# Patient Record
Sex: Female | Born: 1935 | Race: Black or African American | Hispanic: No | Smoking: Never smoker
Health system: Southern US, Community
[De-identification: ages and names within clinical notes are randomized; demographics above are authoritative.]

## PROBLEM LIST (undated history)

## (undated) DIAGNOSIS — I82409 Acute embolism and thrombosis of unspecified deep veins of unspecified lower extremity: Secondary | ICD-10-CM

## (undated) DIAGNOSIS — I10 Essential (primary) hypertension: Secondary | ICD-10-CM

## (undated) DIAGNOSIS — N289 Disorder of kidney and ureter, unspecified: Secondary | ICD-10-CM

## (undated) DIAGNOSIS — C169 Malignant neoplasm of stomach, unspecified: Secondary | ICD-10-CM

## (undated) HISTORY — PX: STOMACH SURGERY: SHX791

## (undated) HISTORY — PX: ANEURYSM COILING: SHX5349

---

## 1978-05-12 DIAGNOSIS — C169 Malignant neoplasm of stomach, unspecified: Secondary | ICD-10-CM

## 1978-05-12 HISTORY — DX: Malignant neoplasm of stomach, unspecified: C16.9

## 2006-05-12 DIAGNOSIS — I82409 Acute embolism and thrombosis of unspecified deep veins of unspecified lower extremity: Secondary | ICD-10-CM

## 2006-05-12 HISTORY — DX: Acute embolism and thrombosis of unspecified deep veins of unspecified lower extremity: I82.409

## 2016-10-02 ENCOUNTER — Emergency Department (HOSPITAL_COMMUNITY): Payer: Medicare Other

## 2016-10-02 ENCOUNTER — Inpatient Hospital Stay (HOSPITAL_COMMUNITY)
Admission: EM | Admit: 2016-10-02 | Discharge: 2016-10-04 | DRG: 683 | Disposition: A | Discharge status: Home / Self Care | Payer: Medicare Other | Attending: Internal Medicine | Admitting: Internal Medicine

## 2016-10-02 ENCOUNTER — Encounter (HOSPITAL_COMMUNITY): Payer: Self-pay

## 2016-10-02 DIAGNOSIS — N183 Chronic kidney disease, stage 3 unspecified: Secondary | ICD-10-CM

## 2016-10-02 DIAGNOSIS — Z86718 Personal history of other venous thrombosis and embolism: Secondary | ICD-10-CM

## 2016-10-02 DIAGNOSIS — E872 Acidosis, unspecified: Secondary | ICD-10-CM

## 2016-10-02 DIAGNOSIS — Z903 Acquired absence of stomach [part of]: Secondary | ICD-10-CM

## 2016-10-02 DIAGNOSIS — D649 Anemia, unspecified: Secondary | ICD-10-CM

## 2016-10-02 DIAGNOSIS — F0390 Unspecified dementia without behavioral disturbance: Secondary | ICD-10-CM | POA: Diagnosis present

## 2016-10-02 DIAGNOSIS — D631 Anemia in chronic kidney disease: Secondary | ICD-10-CM | POA: Diagnosis present

## 2016-10-02 DIAGNOSIS — E441 Mild protein-calorie malnutrition: Secondary | ICD-10-CM | POA: Diagnosis present

## 2016-10-02 DIAGNOSIS — Z85028 Personal history of other malignant neoplasm of stomach: Secondary | ICD-10-CM

## 2016-10-02 DIAGNOSIS — N179 Acute kidney failure, unspecified: Principal | ICD-10-CM | POA: Diagnosis present

## 2016-10-02 DIAGNOSIS — R627 Adult failure to thrive: Secondary | ICD-10-CM | POA: Diagnosis present

## 2016-10-02 DIAGNOSIS — K5900 Constipation, unspecified: Secondary | ICD-10-CM | POA: Diagnosis present

## 2016-10-02 DIAGNOSIS — Z9049 Acquired absence of other specified parts of digestive tract: Secondary | ICD-10-CM

## 2016-10-02 DIAGNOSIS — R531 Weakness: Secondary | ICD-10-CM

## 2016-10-02 DIAGNOSIS — Z681 Body mass index (BMI) 19 or less, adult: Secondary | ICD-10-CM

## 2016-10-02 DIAGNOSIS — M7989 Other specified soft tissue disorders: Secondary | ICD-10-CM | POA: Diagnosis not present

## 2016-10-02 DIAGNOSIS — N184 Chronic kidney disease, stage 4 (severe): Secondary | ICD-10-CM | POA: Diagnosis present

## 2016-10-02 DIAGNOSIS — E869 Volume depletion, unspecified: Secondary | ICD-10-CM | POA: Diagnosis present

## 2016-10-02 DIAGNOSIS — I129 Hypertensive chronic kidney disease with stage 1 through stage 4 chronic kidney disease, or unspecified chronic kidney disease: Secondary | ICD-10-CM | POA: Diagnosis present

## 2016-10-02 HISTORY — DX: Malignant neoplasm of stomach, unspecified: C16.9

## 2016-10-02 HISTORY — DX: Essential (primary) hypertension: I10

## 2016-10-02 HISTORY — DX: Disorder of kidney and ureter, unspecified: N28.9

## 2016-10-02 HISTORY — DX: Acute embolism and thrombosis of unspecified deep veins of unspecified lower extremity: I82.409

## 2016-10-02 LAB — URINALYSIS, ROUTINE W REFLEX MICROSCOPIC
Bilirubin Urine: NEGATIVE
Glucose, UA: NEGATIVE mg/dL
HGB URINE DIPSTICK: NEGATIVE
Ketones, ur: NEGATIVE mg/dL
Nitrite: NEGATIVE
Protein, ur: NEGATIVE mg/dL
SPECIFIC GRAVITY, URINE: 1.01 (ref 1.005–1.030)
Squamous Epithelial / LPF: NONE SEEN
pH: 5 (ref 5.0–8.0)

## 2016-10-02 LAB — COMPREHENSIVE METABOLIC PANEL
ALK PHOS: 79 U/L (ref 38–126)
ALT: 31 U/L (ref 14–54)
AST: 24 U/L (ref 15–41)
Albumin: 3.3 g/dL — ABNORMAL LOW (ref 3.5–5.0)
Anion gap: 12 (ref 5–15)
BUN: 77 mg/dL — AB (ref 6–20)
CALCIUM: 8.8 mg/dL — AB (ref 8.9–10.3)
CO2: 17 mmol/L — ABNORMAL LOW (ref 22–32)
CREATININE: 2.16 mg/dL — AB (ref 0.44–1.00)
Chloride: 112 mmol/L — ABNORMAL HIGH (ref 101–111)
GFR, EST AFRICAN AMERICAN: 21 mL/min — AB (ref >60)
GFR, EST NON AFRICAN AMERICAN: 18 mL/min — AB (ref >60)
Glucose, Bld: 120 mg/dL — ABNORMAL HIGH (ref 65–99)
Potassium: 4.1 mmol/L (ref 3.5–5.1)
Sodium: 141 mmol/L (ref 135–145)
Total Bilirubin: 0.5 mg/dL (ref 0.3–1.2)
Total Protein: 6.6 g/dL (ref 6.5–8.1)

## 2016-10-02 LAB — CBC WITH DIFFERENTIAL/PLATELET
Basophils Absolute: 0 10*3/uL (ref 0.0–0.1)
Basophils Relative: 0 %
EOS ABS: 0 10*3/uL (ref 0.0–0.7)
EOS PCT: 0 %
HCT: 33.7 % — ABNORMAL LOW (ref 36.0–46.0)
Hemoglobin: 11.2 g/dL — ABNORMAL LOW (ref 12.0–15.0)
LYMPHS ABS: 1.1 10*3/uL (ref 0.7–4.0)
Lymphocytes Relative: 21 %
MCH: 30.6 pg (ref 26.0–34.0)
MCHC: 33.2 g/dL (ref 30.0–36.0)
MCV: 92.1 fL (ref 78.0–100.0)
MONO ABS: 0.2 10*3/uL (ref 0.1–1.0)
MONOS PCT: 4 %
NEUTROS PCT: 75 %
Neutro Abs: 3.8 10*3/uL (ref 1.7–7.7)
PLATELETS: 210 10*3/uL (ref 150–400)
RBC: 3.66 MIL/uL — ABNORMAL LOW (ref 3.87–5.11)
RDW: 14.7 % (ref 11.5–15.5)
WBC: 5.1 10*3/uL (ref 4.0–10.5)

## 2016-10-02 LAB — I-STAT CG4 LACTIC ACID, ED: Lactic Acid, Venous: 1.29 mmol/L (ref 0.5–1.9)

## 2016-10-02 LAB — TSH: TSH: 1.244 u[IU]/mL (ref 0.350–4.500)

## 2016-10-02 MED ORDER — ONDANSETRON HCL 4 MG/2ML IJ SOLN
4.0000 mg | Freq: Four times a day (QID) | INTRAMUSCULAR | Status: DC | PRN
Start: 2016-10-02 — End: 2016-10-04

## 2016-10-02 MED ORDER — SODIUM CHLORIDE 0.9 % IV BOLUS (SEPSIS)
1000.0000 mL | Freq: Once | INTRAVENOUS | Status: AC
  Administered 2016-10-02: 1000 mL via INTRAVENOUS

## 2016-10-02 MED ORDER — ENSURE ENLIVE PO LIQD: 237.0000 mL | Freq: Two times a day (BID) | ORAL | Status: DC

## 2016-10-02 MED ORDER — LACTATED RINGERS IV SOLN
INTRAVENOUS | Status: DC
  Administered 2016-10-02: 20:00:00 via INTRAVENOUS

## 2016-10-02 MED ORDER — HYDRALAZINE HCL 20 MG/ML IJ SOLN: 5.0000 mg | INTRAMUSCULAR | Status: DC | PRN

## 2016-10-02 MED ORDER — ONDANSETRON HCL 4 MG PO TABS: 4.0000 mg | ORAL_TABLET | Freq: Four times a day (QID) | ORAL | Status: DC | PRN

## 2016-10-02 MED ORDER — ACETAMINOPHEN 325 MG PO TABS: 650.0000 mg | ORAL_TABLET | Freq: Four times a day (QID) | ORAL | Status: DC | PRN

## 2016-10-02 MED ORDER — BISACODYL 5 MG PO TBEC
5.0000 mg | DELAYED_RELEASE_TABLET | Freq: Every day | ORAL | Status: DC | PRN
  Administered 2016-10-03: 5 mg via ORAL
  Filled 2016-10-02: qty 1

## 2016-10-02 MED ORDER — LATANOPROST 0.005 % OP SOLN
1.0000 [drp] | Freq: Every day | OPHTHALMIC | Status: DC
  Administered 2016-10-03: 1 [drp] via OPHTHALMIC
  Filled 2016-10-02: qty 2.5

## 2016-10-02 MED ORDER — DOCUSATE SODIUM 100 MG PO CAPS
100.0000 mg | ORAL_CAPSULE | Freq: Two times a day (BID) | ORAL | Status: DC
  Administered 2016-10-02 – 2016-10-03 (×3): 100 mg via ORAL
  Filled 2016-10-02 (×4): qty 1

## 2016-10-02 MED ORDER — POLYETHYLENE GLYCOL 3350 17 G PO PACK
17.0000 g | PACK | Freq: Every day | ORAL | Status: DC | PRN
  Administered 2016-10-02: 17 g via ORAL
  Filled 2016-10-02: qty 1

## 2016-10-02 MED ORDER — TIMOLOL MALEATE 0.5 % OP SOLN
1.0000 [drp] | Freq: Every day | OPHTHALMIC | Status: DC
  Administered 2016-10-03: 1 [drp] via OPHTHALMIC
  Filled 2016-10-02: qty 5

## 2016-10-02 MED ORDER — ENOXAPARIN SODIUM 30 MG/0.3ML ~~LOC~~ SOLN
30.0000 mg | SUBCUTANEOUS | Status: DC
  Administered 2016-10-02 – 2016-10-03 (×2): 30 mg via SUBCUTANEOUS
  Filled 2016-10-02 (×2): qty 0.3

## 2016-10-02 MED ORDER — FLEET ENEMA 7-19 GM/118ML RE ENEM: 1.0000 | ENEMA | Freq: Once | RECTAL | Status: DC | PRN

## 2016-10-02 MED ORDER — ACETAMINOPHEN 650 MG RE SUPP: 650.0000 mg | Freq: Four times a day (QID) | RECTAL | Status: DC | PRN

## 2016-10-02 NOTE — ED Notes (Signed)
Per Dr. Rosemary Holms office, pt sent to ER for acute kidney injury, dehydration, and UTI.  Reports creatinine 2.2.

## 2016-10-02 NOTE — ED Triage Notes (Signed)
Pt also sent for evaluation of elevated BUN and Creatinine which was drawn this morning.  (BUN-75, Creat 2.2).  Daughter reports decreased intake over for the past week.

## 2016-10-02 NOTE — ED Provider Notes (Signed)
[facility] DEPT Provider Note   CSN: [id number] Arrival date & time: 10/02/16  1420     History   Chief Complaint Chief Complaint  Patient presents with  . Abnormal Lab    HPI Haley Hart is a 83 y.o. female.  The history is provided by a relative. The history is limited by the condition of the patient (Dementia).  She has had a general decline in condition over the last 2 weeks. She started by coming constipated. Daughter states that she has not had a bowel movement in approximately the last 10 days. This is unusual for the patient. She is also complaining of generalized weakness. Over the last week, she has had decreased oral intake. In spite of families urging, she is taking only small amounts of liquids and refusing things like Ensure. For the last 5 days, they have noted some swelling in her feet. There has been no fever or chills or sweats. She has not had any cough. She has not had any urinary complaints. She is not complaining of hurting anywhere. She denies nausea or vomiting. She was taken to her PCP today with blood work showed elevated BUN and creatinine and she was directed to come to the emergency department.  Past Medical History:  Diagnosis Date  . Hypertension   . Kidney disease   . Stomach cancer ([place])     There are no active problems to display for this patient.   Past Surgical History:  Procedure Laterality Date  . ANEURYSM COILING    . STOMACH SURGERY     Remove part of stomach and intesting d/t cancer     OB History    No data available       Home Medications    Prior to Admission medications   Not on File    Family History No family history on file.  Social History Social History  Substance Use Topics  . Smoking status: Not on file  . Smokeless tobacco: Not on file  . Alcohol use Not on file     Allergies   Patient has no allergy information on record.   Review of Systems Review of Systems  All other systems reviewed and  are negative.    Physical Exam Updated Vital Signs BP (!) 142/75   Pulse 73   Temp 97.5 F (36.4 C) (Oral)   Resp 20   Ht 5' (1.524 m)   Wt 41.3 kg (91 lb)   SpO2 99%   BMI 17.77 kg/m   Physical Exam  Nursing note and vitals reviewed.  83 year old female, resting comfortably and in no acute distress. Vital signs are significant for borderline hypertension. Oxygen saturation is 99%, which is normal. Head is normocephalic and atraumatic. PERRLA, EOMI. Oropharynx is clear. Neck is nontender and supple without adenopathy or JVD. Back is nontender and there is no CVA tenderness. Lungs are clear without rales, wheezes, or rhonchi. Chest is nontender. Heart has regular rate and rhythm without murmur. Abdomen is soft, flat, nontender without masses or hepatosplenomegaly and peristalsis is normoactive. Extremities have 1+ pedal edema, full range of motion is present. Skin is warm and dry without rash. Neurologic: She is sleeping but easily arousable. When aroused, speech is normal and fluent. She is oriented to person and place but not time cranial nerves are intact, there are no motor or sensory deficits.  ED Treatments / Results  Labs (all labs ordered are listed, but only abnormal results are displayed) Labs Reviewed  COMPREHENSIVE METABOLIC PANEL - Abnormal; Notable for the following:       Result Value   Chloride 112 (*)    CO2 17 (*)    Glucose, Bld 120 (*)    BUN 77 (*)    Creatinine, Ser 2.16 (*)    Calcium 8.8 (*)    Albumin 3.3 (*)    GFR calc non Af Amer 18 (*)    GFR calc Af Amer 21 (*)    All other components within normal limits  CBC WITH DIFFERENTIAL/PLATELET - Abnormal; Notable for the following:    RBC 3.66 (*)    Hemoglobin 11.2 (*)    HCT 33.7 (*)    All other components within normal limits  TSH  URINALYSIS, ROUTINE W REFLEX MICROSCOPIC  I-STAT CG4 LACTIC ACID, ED    Radiology Dg Chest 2 View  Result Date: 10/02/2016 CLINICAL DATA:  Weakness and  dehydration. Decreased p.o. intake for 1 week. EXAM: CHEST  2 VIEW COMPARISON:  None. FINDINGS: The patient is rotated on the PA view. Lungs appear clear. Heart size is upper normal. No pneumothorax or pleural effusion. No acute bony abnormality. IMPRESSION: No acute disease. Electronically Signed   By: Inge Rise M.D.   On: 10/02/2016 15:35    Procedures Procedures (including critical care time)  Medications Ordered in ED Medications  sodium chloride 0.9 % bolus 1,000 mL (1,000 mLs Intravenous New Bag/Given 10/02/16 1515)     Initial Impression / Assessment and Plan / ED Course  I have reviewed the triage vital signs and the nursing notes.  Pertinent labs & imaging results that were available during my care of the patient were reviewed by me and considered in my medical decision making (see chart for details).  Generalized decline over the last 2 weeks. She has no old records and the [facility] system. Laboratory workup from her physicians office shows mild anemia, and renal insufficiency with creatinine 2.2, BUN 75. Unfortunately, no prior values were provided. Given the patient's general decline, she probably does have moderate prerenal azotemia. She is given IV fluids. We'll check a chest x-ray to rule out occult pneumonia, and will check TSH. Anticipate that she will need to be admitted for 1-2 days of rehydration. Also, CODE STATUS was discussed with patient's daughter, but no decision was made. Importance of knowing CODE STATUS prior to emergency presenting itself was stressed.  Laboratory workup but confirms prerenal azotemia. Metabolic acidosis is noted on metabolic panel which had not been noted at PCPs office. This is within normal anion gap. TSH is normal. Case is discussed with Dr. Lorin Mercy [facility] who agrees to admit the patient.  Final Clinical Impressions(s) / ED Diagnoses   Final diagnoses:  Acute kidney injury (nontraumatic) (HCC)  Metabolic acidosis    Normochromic normocytic anemia  Weakness    New Prescriptions New Prescriptions   No medications on file     Delora Fuel, MD 19/37/90 1627

## 2016-10-02 NOTE — ED Triage Notes (Signed)
Pt came from [facility] PCP. I. PCP states they cant get patient to eat or drink very much. Presenting with foot problems. Onset has been gradual and has been persistent for 1 week. Feet have been swollen for 1 week.

## 2016-10-02 NOTE — H&P (Signed)
History and Physical    Haley Hart EXB:[id number] DOB: 24-Sep-1915 DOA: 10/02/2016  PCP: Sherrilee Gilles, DO Consultants:  None Patient coming from: Home - lives with daughter; Donald Prose: daughter, [phone]  Chief Complaint: abnormal lab  HPI: Haley Hart is a 83 y.o. female with medical history significant of remote stomach cancer; remote DVT/PE; HTN; and "kidney disease" presenting with feet swelling, no BM in a week, not eating well, drinking less fluids.  Feet swelling started last Sunday, seems to be improving.  She usually has a BM at least once a week, but she hasn't had one this week.  Daughter is giving her stool softeners but that isn't helping.  She has not tried anything else.  She usually drinks Ensure, but has been reluctant to drink that.  No fevers.  No urinary symptoms.  Sleep-wake cycle is off, may not recognize need to void.  She went to her PCP today and had an elevated BUN/creatinine and so was sent to the ER.   ED Course: No prior labs available in Epic, ?prerenal azotemia.  Given IVF.  Plan for obs for 1-2 days.    Review of Systems: As per HPI; otherwise review of systems reviewed and negative. This is suspect due to patient's general contentedness to not answer questions.  Ambulatory Status:  Needs assistance to walk and has been more weak over the last week  Past Medical History:  Diagnosis Date  . DVT (deep venous thrombosis) ([place]) 2008  . Hypertension   . Kidney disease   . Stomach cancer ([place]) 1980    Past Surgical History:  Procedure Laterality Date  . ANEURYSM COILING    . STOMACH SURGERY     Remove part of stomach and intesting d/t cancer     Social History   Social History  . Marital status: Widowed    Spouse name: N/A  . Number of children: N/A  . Years of education: N/A   Occupational History  . retired    Social History Main Topics  . Smoking status: Never Smoker  . Smokeless tobacco: Never Used  . Alcohol use No  . Drug use: No  .  Sexual activity: Not on file   Other Topics Concern  . Not on file   Social History Narrative  . No narrative on file    Not on File  History reviewed. No pertinent family history.  Prior to Admission medications   Medication Sig Start Date End Date Taking? Authorizing Provider  hydrochlorothiazide (HYDRODIURIL) 25 MG tablet Take 25 mg by mouth daily.   Yes [provider]  latanoprost (XALATAN) 0.005 % ophthalmic solution Place 1 drop into both eyes at bedtime.   Yes [provider]  lisinopril (PRINIVIL,ZESTRIL) 10 MG tablet Take 10 mg by mouth daily.   Yes [provider]  timolol (BETIMOL) 0.5 % ophthalmic solution Place 1 drop into the left eye daily.   Yes [provider]    Physical Exam: Vitals:   10/02/16 1730 10/02/16 1800 10/02/16 1830 10/02/16 1842  BP: (!) 175/84 (!) 170/79 (!) 190/80 (!) 191/92  Pulse: 71 70  82  Resp: 17 15 18 18   Temp:   97.9 F (36.6 C) 98.1 F (36.7 C)  TempSrc:   Oral Oral  SpO2: 98% 97% 94% 97%  Weight:    44.3 kg (97 lb 11.2 oz)  Height:    5' (1.524 m)     General:  Appears calm and comfortable and is NAD Eyes:  PERRL, EOMI, normal lids, iris ENT:  grossly normal hearing, lips & tongue, mmm Neck:  no LAD, masses or thyromegaly Cardiovascular:  RRR, no m/r/g. 1+ LE edema, pedal only.  Respiratory:  CTA bilaterally, no w/r/r. Normal respiratory effort. Abdomen:  soft, ntnd, NABS Skin:  no rash or induration seen on limited exam Musculoskeletal:  grossly normal tone BUE/BLE, good ROM, no bony abnormality Psychiatric:  grossly normal mood and affect, speech fluent and appropriate, AOx2 Neurologic:  CN 2-12 grossly intact, moves all extremities in coordinated fashion, sensation intact  Labs on Admission: I have personally reviewed following labs and imaging studies  CBC:  Recent Labs Lab 10/02/16 1445  WBC 5.1  NEUTROABS 3.8  HGB 11.2*  HCT 33.7*  MCV 92.1  PLT 017   Basic Metabolic  Panel:  Recent Labs Lab 10/02/16 1445  NA 141  K 4.1  CL 112*  CO2 17*  GLUCOSE 120*  BUN 77*  CREATININE 2.16*  CALCIUM 8.8*   GFR: Estimated Creatinine Clearance: 10.2 mL/min (A) (by C-G formula based on SCr of 2.16 mg/dL (H)). Liver Function Tests:  Recent Labs Lab 10/02/16 1445  AST 24  ALT 31  ALKPHOS 79  BILITOT 0.5  PROT 6.6  ALBUMIN 3.3*   No results for input(s): LIPASE, AMYLASE in the last 168 hours. No results for input(s): AMMONIA in the last 168 hours. Coagulation Profile: No results for input(s): INR, PROTIME in the last 168 hours. Cardiac Enzymes: No results for input(s): CKTOTAL, CKMB, CKMBINDEX, TROPONINI in the last 168 hours. BNP (last 3 results) No results for input(s): PROBNP in the last 8760 hours. HbA1C: No results for input(s): HGBA1C in the last 72 hours. CBG: No results for input(s): GLUCAP in the last 168 hours. Lipid Profile: No results for input(s): CHOL, HDL, LDLCALC, TRIG, CHOLHDL, LDLDIRECT in the last 72 hours. Thyroid Function Tests:  Recent Labs  10/02/16 1445  TSH 1.244   Anemia Panel: No results for input(s): VITAMINB12, FOLATE, FERRITIN, TIBC, IRON, RETICCTPCT in the last 72 hours. Urine analysis:    Component Value Date/Time   COLORURINE YELLOW 10/02/2016 1505   APPEARANCEUR HAZY (A) 10/02/2016 1505   LABSPEC 1.010 10/02/2016 1505   PHURINE 5.0 10/02/2016 1505   GLUCOSEU NEGATIVE 10/02/2016 1505   HGBUR NEGATIVE 10/02/2016 1505   BILIRUBINUR NEGATIVE 10/02/2016 1505   KETONESUR NEGATIVE 10/02/2016 1505   PROTEINUR NEGATIVE 10/02/2016 1505   NITRITE NEGATIVE 10/02/2016 1505   LEUKOCYTESUR TRACE (A) 10/02/2016 1505    Creatinine Clearance: Estimated Creatinine Clearance: 10.2 mL/min (A) (by C-G formula based on SCr of 2.16 mg/dL (H)).  Sepsis Labs: @LABRCNTIP (procalcitonin:4,lacticidven:4) )No results found for this or any previous visit (from the past 240 hour(s)).   Radiological Exams on Admission: Dg  Chest 2 View  Result Date: 10/02/2016 CLINICAL DATA:  Weakness and dehydration. Decreased p.o. intake for 1 week. EXAM: CHEST  2 VIEW COMPARISON:  None. FINDINGS: The patient is rotated on the PA view. Lungs appear clear. Heart size is upper normal. No pneumothorax or pleural effusion. No acute bony abnormality. IMPRESSION: No acute disease. Electronically Signed   By: Inge Rise M.D.   On: 10/02/2016 15:35    EKG: Not done  Assessment/Plan Principal Problem:   AKI (acute kidney injury) ([place]) Active Problems:   Mild protein-calorie malnutrition (HCC)   Constipation   AKI -BUN 77/Creatinine 2.16/GFR 21 -Unknown baseline -However, she was sent in by PCP office so presumably there is at least some component of AKI/acute renal failure  here -Additionally, she does have a mild metabolic acidosis (CO2 17) without anion gap and so this also makes an acute component with volume depletion seem more likely -Will observe overnight with gentle IVF hydration -Follow up renal function by BMP -Avoid ACEI and NSAIDs -Lactate 1.29 -UA unremarkable  Constipation -Suspect that this is majority of patient's issues -She normally has a BM at least once weekly -It has been >1 week since last BM -Will use bowel regimen to try to help facilitate BM  Malnutrition -Albumin 3.3 -May be acute and related to constipation and thus decreased PO -Consider outpatient nutrition evaluation    DVT prophylaxis:  Lovenox  Code Status:  Full - confirmed with family Family Communication: Daughter present throughout evaluation Disposition Plan:  Home once clinically improved Consults called: None  Admission status: It is my clinical opinion that referral for OBSERVATION is reasonable and necessary in this patient based on the above information provided. The aforementioned taken together are felt to place the patient at high risk for further clinical deterioration. However it is anticipated that the patient may  be medically stable for discharge from the hospital within 24 to 48 hours.    Karmen Bongo MD [facility]  If 7PM-7AM, please contact night-coverage [email or web address] Password [email or web address]  10/02/2016, 8:45 PM

## 2016-10-02 NOTE — ED Notes (Signed)
Faxed [facility] for medical records per request By Dr Lorin Mercy. Daughter signed consent

## 2016-10-03 DIAGNOSIS — E872 Acidosis: Secondary | ICD-10-CM | POA: Diagnosis present

## 2016-10-03 DIAGNOSIS — M7989 Other specified soft tissue disorders: Secondary | ICD-10-CM | POA: Diagnosis present

## 2016-10-03 DIAGNOSIS — I129 Hypertensive chronic kidney disease with stage 1 through stage 4 chronic kidney disease, or unspecified chronic kidney disease: Secondary | ICD-10-CM | POA: Diagnosis present

## 2016-10-03 DIAGNOSIS — F0390 Unspecified dementia without behavioral disturbance: Secondary | ICD-10-CM | POA: Diagnosis present

## 2016-10-03 DIAGNOSIS — K5909 Other constipation: Secondary | ICD-10-CM | POA: Diagnosis not present

## 2016-10-03 DIAGNOSIS — Z681 Body mass index (BMI) 19 or less, adult: Secondary | ICD-10-CM | POA: Diagnosis not present

## 2016-10-03 DIAGNOSIS — N179 Acute kidney failure, unspecified: Secondary | ICD-10-CM | POA: Diagnosis not present

## 2016-10-03 DIAGNOSIS — R531 Weakness: Secondary | ICD-10-CM

## 2016-10-03 DIAGNOSIS — Z903 Acquired absence of stomach [part of]: Secondary | ICD-10-CM | POA: Diagnosis not present

## 2016-10-03 DIAGNOSIS — E869 Volume depletion, unspecified: Secondary | ICD-10-CM | POA: Diagnosis present

## 2016-10-03 DIAGNOSIS — R627 Adult failure to thrive: Secondary | ICD-10-CM | POA: Diagnosis present

## 2016-10-03 DIAGNOSIS — N184 Chronic kidney disease, stage 4 (severe): Secondary | ICD-10-CM | POA: Diagnosis present

## 2016-10-03 DIAGNOSIS — K5900 Constipation, unspecified: Secondary | ICD-10-CM | POA: Diagnosis present

## 2016-10-03 DIAGNOSIS — Z9049 Acquired absence of other specified parts of digestive tract: Secondary | ICD-10-CM | POA: Diagnosis not present

## 2016-10-03 DIAGNOSIS — Z85028 Personal history of other malignant neoplasm of stomach: Secondary | ICD-10-CM | POA: Diagnosis not present

## 2016-10-03 DIAGNOSIS — Z86718 Personal history of other venous thrombosis and embolism: Secondary | ICD-10-CM | POA: Diagnosis not present

## 2016-10-03 DIAGNOSIS — D631 Anemia in chronic kidney disease: Secondary | ICD-10-CM | POA: Diagnosis present

## 2016-10-03 DIAGNOSIS — N183 Chronic kidney disease, stage 3 (moderate): Secondary | ICD-10-CM | POA: Diagnosis not present

## 2016-10-03 DIAGNOSIS — E441 Mild protein-calorie malnutrition: Secondary | ICD-10-CM | POA: Diagnosis present

## 2016-10-03 LAB — CBC
HEMATOCRIT: 30 % — AB (ref 36.0–46.0)
HEMOGLOBIN: 10 g/dL — AB (ref 12.0–15.0)
MCH: 30.6 pg (ref 26.0–34.0)
MCHC: 33.3 g/dL (ref 30.0–36.0)
MCV: 91.7 fL (ref 78.0–100.0)
Platelets: 183 10*3/uL (ref 150–400)
RBC: 3.27 MIL/uL — AB (ref 3.87–5.11)
RDW: 14.7 % (ref 11.5–15.5)
WBC: 4 10*3/uL (ref 4.0–10.5)

## 2016-10-03 LAB — BASIC METABOLIC PANEL
ANION GAP: 8 (ref 5–15)
BUN: 71 mg/dL — ABNORMAL HIGH (ref 6–20)
CALCIUM: 8.2 mg/dL — AB (ref 8.9–10.3)
CO2: 18 mmol/L — AB (ref 22–32)
Chloride: 115 mmol/L — ABNORMAL HIGH (ref 101–111)
Creatinine, Ser: 1.86 mg/dL — ABNORMAL HIGH (ref 0.44–1.00)
GFR calc non Af Amer: 21 mL/min — ABNORMAL LOW (ref >60)
GFR, EST AFRICAN AMERICAN: 25 mL/min — AB (ref >60)
GLUCOSE: 107 mg/dL — AB (ref 65–99)
POTASSIUM: 4 mmol/L (ref 3.5–5.1)
Sodium: 141 mmol/L (ref 135–145)

## 2016-10-03 MED ORDER — POTASSIUM CHLORIDE IN NACL 20-0.9 MEQ/L-% IV SOLN
INTRAVENOUS | Status: DC
  Administered 2016-10-03: 18:00:00 via INTRAVENOUS

## 2016-10-03 MED ORDER — SENNA 8.6 MG PO TABS
1.0000 | ORAL_TABLET | Freq: Every day | ORAL | Status: DC
  Administered 2016-10-03: 8.6 mg via ORAL
  Filled 2016-10-03: qty 1

## 2016-10-03 MED ORDER — SODIUM CHLORIDE 0.9 % IV SOLN: INTRAVENOUS | Status: DC

## 2016-10-03 NOTE — Care Management Note (Signed)
Case Management Note  Patient Details  Name: Haley Hart MRN: [id number] Date of Birth: 1915-07-31  Subjective/Objective: Adm with AKI. From home with daughter. Walks with cane. Daughters are CAP aides for patient, patient has 24 hour supervision. Patient has PCP, daughters drive her to appointments. Reports no issues affording medications. No HH currently, denies need for Home health.                  Action/Plan: Anticipate DC home in care of daughters. No CM needs anticipated.    Expected Discharge Date:      10/04/2016            Expected Discharge Plan:  Home/Self Care  In-House Referral:     Discharge planning Services  CM Consult  Post Acute Care Choice:    Choice offered to:  NA  DME Arranged:    DME Agency:     HH Arranged:    HH Agency:     Status of Service:  In process, will continue to follow  If discussed at Long Length of Stay Meetings, dates discussed:    Additional Comments:  Lyndall Bellot, Chauncey Reading, RN 10/03/2016, 10:40 AM

## 2016-10-03 NOTE — Progress Notes (Signed)
PROGRESS NOTE  Haley Hart TMA:[id number] DOB: 11/03/1915 DOA: 10/02/2016 PCP: Sherrilee Gilles, DO  Brief History:  83 year old female with a history of dementia, hypertension and CKD presenting with 1-2 week history of general weakness. The patient is a poor historian. The daughter states that the patient has had a gradual functional decline over the past 1-2 weeks. Despite taking MiraLAX, the patient has not had a bowel movement in nearly 10 days. As a result, she has had decreased oral intake. There were no reports of vomiting, chest pain, shortness of breath, abdominal pain, dysuria, hematuria. The patient went to see her primary care provider, and she was noted to have elevated serum creatinine. As a result, the patient was sent to emergency department for evaluation. In the emergency room, the patient was afebrile and hemodynamically stable with serum creatinine of 2.16. She was started on intravenous fluids.  Assessment/Plan: Acute kidney injury -Secondary to volume depletion -Presenting creatinine 2.16 -Continue IV fluids -Holding lisinopril and HCTZ  Constipation -The patient has had a bowel movement with the assistance of cathartics in the hospital -Continue regular bowel regimen  Failure to thrive/anorexia -Liberalize diet -Nutrition evaluation  Generalized weakness -Secondary to the above discussion -PT evaluation -Serum B12 -TSH 1.244 -Urinalysis is negative for pyuria    Disposition Plan:   Home 5/26 if stable  Family Communication:   Daughter updated at bedside  Consultants:  none  Code Status:  FULL   DVT Prophylaxis:  [place] Lovenox   Procedures: As Listed in Progress Note Above  Antibiotics: None    Subjective: Patient denies fevers, chills, headache, chest pain, dyspnea, nausea, vomiting, diarrhea, abdominal pain, dysuria, hematuria, hematochezia, and melena.   Objective: Vitals:   10/02/16 2328 10/03/16 0500 10/03/16 0927 10/03/16 1300    BP: 137/74 135/72  (!) 147/72  Pulse: 78 69  66  Resp: 20 18  18   Temp: 98.5 F (36.9 C) 98.7 F (37.1 C)  98.6 F (37 C)  TempSrc: Oral Oral  Oral  SpO2: 100% 99% 94% 98%  Weight:      Height:        Intake/Output Summary (Last 24 hours) at 10/03/16 1810 Last data filed at 10/03/16 1700  Gross per 24 hour  Intake              735 ml  Output                0 ml  Net              735 ml   Weight change:  Exam:   General:  Pt is alert, follows commands appropriately, not in acute distress  HEENT: No icterus, No thrush, No neck mass, [place]/AT  Cardiovascular: RRR, S1/S2, no rubs, no gallops  Respiratory: CTA bilaterally, no wheezing, no crackles, no rhonchi  Abdomen: Soft/+BS, non tender, non distended, no guarding  Extremities: No edema, No lymphangitis, No petechiae, No rashes, no synovitis   Data Reviewed: I have personally reviewed following labs and imaging studies Basic Metabolic Panel:  Recent Labs Lab 10/02/16 1445 10/03/16 0627  NA 141 141  K 4.1 4.0  CL 112* 115*  CO2 17* 18*  GLUCOSE 120* 107*  BUN 77* 71*  CREATININE 2.16* 1.86*  CALCIUM 8.8* 8.2*   Liver Function Tests:  Recent Labs Lab 10/02/16 1445  AST 24  ALT 31  ALKPHOS 79  BILITOT 0.5  PROT 6.6  ALBUMIN 3.3*  No results for input(s): LIPASE, AMYLASE in the last 168 hours. No results for input(s): AMMONIA in the last 168 hours. Coagulation Profile: No results for input(s): INR, PROTIME in the last 168 hours. CBC:  Recent Labs Lab 10/02/16 1445 10/03/16 0627  WBC 5.1 4.0  NEUTROABS 3.8  --   HGB 11.2* 10.0*  HCT 33.7* 30.0*  MCV 92.1 91.7  PLT 210 183   Cardiac Enzymes: No results for input(s): CKTOTAL, CKMB, CKMBINDEX, TROPONINI in the last 168 hours. BNP: Invalid input(s): POCBNP CBG: No results for input(s): GLUCAP in the last 168 hours. HbA1C: No results for input(s): HGBA1C in the last 72 hours. Urine analysis:    Component Value Date/Time   COLORURINE  YELLOW 10/02/2016 1505   APPEARANCEUR HAZY (A) 10/02/2016 1505   LABSPEC 1.010 10/02/2016 1505   PHURINE 5.0 10/02/2016 1505   GLUCOSEU NEGATIVE 10/02/2016 1505   HGBUR NEGATIVE 10/02/2016 1505   [place] 10/02/2016 1505   [place] 10/02/2016 1505   PROTEINUR NEGATIVE 10/02/2016 1505   NITRITE NEGATIVE 10/02/2016 1505   LEUKOCYTESUR TRACE (A) 10/02/2016 1505   Sepsis Labs: @LABRCNTIP (procalcitonin:4,lacticidven:4) )No results found for this or any previous visit (from the past 240 hour(s)).   Scheduled Meds: . docusate sodium  100 mg Oral BID  . enoxaparin (LOVENOX) injection  30 mg Subcutaneous Q24H  . feeding supplement (ENSURE ENLIVE)  237 mL Oral BID BM  . latanoprost  1 drop Both Eyes QHS  . timolol  1 drop Left Eye Daily   Continuous Infusions: . 0.9 % NaCl with KCl 20 mEq / L      Procedures/Studies: Dg Chest 2 View  Result Date: 10/02/2016 CLINICAL DATA:  Weakness and dehydration. Decreased p.o. intake for 1 week. EXAM: CHEST  2 VIEW COMPARISON:  None. FINDINGS: The patient is rotated on the PA view. Lungs appear clear. Heart size is upper normal. No pneumothorax or pleural effusion. No acute bony abnormality. IMPRESSION: No acute disease. Electronically Signed   By: Inge Rise M.D.   On: 10/02/2016 15:35    Sedra Morfin, DO  [facility] Pager [phone]  If 7PM-7AM, please contact night-coverage [email or web address] Password [email or web address] 10/03/2016, 6:10 PM   LOS: 0 days

## 2016-10-03 NOTE — Progress Notes (Signed)
Initial Nutrition Assessment   INTERVENTION/Recommendations:    Increase intake of dried beans and/or add green peas to casseroles or soups.  Choose fresh fruit and vegetables vs processed foods.  Eat fruits and vegetables with peels or skins on.  Consume adequate fluid daily   Slowly increase fiber intake  NUTRITION DIAGNOSIS:   Altered GI function related to  (constipation) as evidenced by  (patient reports no BM in > 1 week).  GOAL:   Patient will meet greater than or equal to 90% of their needs while resuming regular bowel movements.   MONITOR:   PO intake, Labs, Weight trends (Stool freqency)  REASON FOR ASSESSMENT:   Malnutrition Screening Tool    ASSESSMENT: Haley Hart is a very pleasant 83 yo female who has moved here from [place] to live with her daughter. She presents to ED from PCP with acute kidney injury. During RD visit she is eating lunch and her daughter is here. Haley Hart feeds herself and appetite is very good. 75% of meals consumed today. At home she follows a regular diet and usually has bacon, eggs fruit and toast with coffee. Lunch: chick soup with side of vegetable (lettuce and tomatoes), meat and vegetables at night or sometimes pasta. She drinks water and lemanade mostly. We talked about some nutritionally related ways to prevent constipation.  Weight hx: reported as ranging from 91-95#. No acute changes in weight for more than a year. Nutrition focused exam: consistent with expected distribution muscle and fat deposits based on her age and mobility hx.     Recent Labs Lab 10/02/16 1445 10/03/16 0627  NA 141 141  K 4.1 4.0  CL 112* 115*  CO2 17* 18*  BUN 77* 71*  CREATININE 2.16* 1.86*  CALCIUM 8.8* 8.2*  GLUCOSE 120* 107*   Labs: BUN 71 and Cr 1.86   Meds: Colace, Ensure Enlive BID  Diet Order:  Diet regular Room service appropriate? Yes; Fluid consistency: Thin  Skin:  Reviewed, no issues  Last BM:  5/17 ( fleets enema ordered)    Height:   Ht Readings from Last 1 Encounters:  10/02/16 5' (1.524 m)    Weight:   Wt Readings from Last 1 Encounters:  10/02/16 97 lb 11.2 oz (44.3 kg)    Ideal Body Weight:  45 kg  BMI:  Body mass index is 19.08 kg/m.  Estimated Nutritional Needs:   Kcal:  1100-1200   Protein:  50-55 gr   Fluid:  1.1-1.2 liters daily  EDUCATION NEEDS:   Education needs addressed (Nutrition related recommendations related to constipation)- see above  Colman Cater Haley,RD,CSG,LDN Office: [phone] Pager: [phone]

## 2016-10-03 NOTE — Care Management Obs Status (Signed)
[facility] NOTIFICATION   Patient Details  Name: Haley Hart MRN: [id number] Date of Birth: March 08, 1916   Medicare Observation Status Notification Given:  Yes    Xoie Kreuser, Chauncey Reading, RN 10/03/2016, 9:23 AM

## 2016-10-04 DIAGNOSIS — N179 Acute kidney failure, unspecified: Secondary | ICD-10-CM

## 2016-10-04 DIAGNOSIS — N183 Chronic kidney disease, stage 3 (moderate): Secondary | ICD-10-CM

## 2016-10-04 LAB — BASIC METABOLIC PANEL
ANION GAP: 6 (ref 5–15)
BUN: 61 mg/dL — ABNORMAL HIGH (ref 6–20)
CHLORIDE: 117 mmol/L — AB (ref 101–111)
CO2: 20 mmol/L — AB (ref 22–32)
Calcium: 8.3 mg/dL — ABNORMAL LOW (ref 8.9–10.3)
Creatinine, Ser: 1.84 mg/dL — ABNORMAL HIGH (ref 0.44–1.00)
GFR calc non Af Amer: 22 mL/min — ABNORMAL LOW (ref >60)
GFR, EST AFRICAN AMERICAN: 25 mL/min — AB (ref >60)
GLUCOSE: 101 mg/dL — AB (ref 65–99)
Potassium: 4 mmol/L (ref 3.5–5.1)
Sodium: 143 mmol/L (ref 135–145)

## 2016-10-04 LAB — MAGNESIUM: Magnesium: 1.5 mg/dL — ABNORMAL LOW (ref 1.7–2.4)

## 2016-10-04 LAB — VITAMIN B12: VITAMIN B 12: 369 pg/mL (ref 180–914)

## 2016-10-04 MED ORDER — DOCUSATE SODIUM 100 MG PO CAPS: 100.0000 mg | ORAL_CAPSULE | Freq: Two times a day (BID) | ORAL | 0 refills | Status: DC

## 2016-10-04 MED ORDER — SENNA 8.6 MG PO TABS: 1.0000 | ORAL_TABLET | Freq: Every day | ORAL | 0 refills | Status: AC

## 2016-10-04 MED ORDER — ENSURE ENLIVE PO LIQD
237.0000 mL | Freq: Two times a day (BID) | ORAL | 0 refills | Status: AC
Start: 2016-10-04 — End: ?

## 2016-10-04 MED ORDER — POLYETHYLENE GLYCOL 3350 17 G PO PACK: 17.0000 g | PACK | Freq: Every day | ORAL | 0 refills | Status: AC

## 2016-10-04 NOTE — Discharge Summary (Signed)
Physician Discharge Summary  Haley Hart JOI:[id number] DOB: 1915/08/29 DOA: 10/02/2016  PCP: Sherrilee Gilles, DO  Admit date: 10/02/2016 Discharge date: 10/04/2016  Admitted From: Home Disposition:  Home  Recommendations for Outpatient Follow-up:  1. Follow up with PCP in 1-2 weeks 2. Please obtain BMP/CBC in one week  Discharge Condition: Stable CODE STATUS: FULL Diet recommendation: Regular   Brief/Interim Summary: 83 year old female with a history of dementia, hypertension and CKD presenting with 1-2 week history of general weakness. The patient is a poor historian. The daughter states that the patient has had a gradual functional decline over the past 1-2 weeks. Despite taking MiraLAX, the patient has not had a bowel movement in nearly 10 days. As a result, she has had decreased oral intake. There were no reports of vomiting, chest pain, shortness of breath, abdominal pain, dysuria, hematuria. The patient went to see her primary care provider, and she was noted to have elevated serum creatinine. As a result, the patient was sent to emergency department for evaluation. In the emergency room, the patient was afebrile and hemodynamically stable with serum creatinine of 2.16. She was started on intravenous fluids.  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 -Secondary to volume depletion -Presenting creatinine 2.16 -discharge creatinine 1.84 -Continue IV fluids during the hospitalization -Holding lisinopril and HCTZ--will not restart -clinically improved, tolerating diet  Constipation -The patient has had a bowel movement with the assistance of cathartics in the hospital -Continue regular bowel regimen after d/c  Hypertension -BP remains acceptable for her age during the hospitalization without anti-HTN meds -due to risk of recurrent dehydration and progressive renal failure, will not restart lisinopril or HCTZ after d/c  Failure to  thrive/anorexia -Liberalize diet -Nutrition evaluation appreciated  Generalized weakness -Secondary to the above discussion -Serum B12--pending at the time of discharge -TSH 1.244 -Urinalysis is negative for pyuria     Discharge Instructions  Discharge Instructions    Diet - low sodium heart healthy    Complete by:  As directed    Increase activity slowly    Complete by:  As directed      Allergies as of 10/04/2016   No Known Allergies     Medication List    STOP taking these medications   hydrochlorothiazide 25 MG tablet Commonly known as:  HYDRODIURIL   lisinopril 10 MG tablet Commonly known as:  PRINIVIL,ZESTRIL     TAKE these medications   feeding supplement (ENSURE ENLIVE) Liqd Take 237 mLs by mouth 2 (two) times daily between meals.   latanoprost 0.005 % ophthalmic solution Commonly known as:  XALATAN Place 1 drop into both eyes at bedtime.   polyethylene glycol packet Commonly known as:  MIRALAX / GLYCOLAX Take 17 g by mouth daily.   senna 8.6 MG Tabs tablet Commonly known as:  SENOKOT Take 1 tablet (8.6 mg total) by mouth daily.   timolol 0.5 % ophthalmic solution Commonly known as:  BETIMOL Place 1 drop into the left eye daily.       No Known Allergies  Consultations:  none   Procedures/Studies: Dg Chest 2 View  Result Date: 10/02/2016 CLINICAL DATA:  Weakness and dehydration. Decreased p.o. intake for 1 week. EXAM: CHEST  2 VIEW COMPARISON:  None. FINDINGS: The patient is rotated on the PA view. Lungs appear clear. Heart size is upper normal. No pneumothorax or pleural effusion. No acute bony abnormality. IMPRESSION: No acute disease. Electronically Signed   By: Inge Rise M.D.   On: 10/02/2016  15:35        Discharge Exam: Vitals:   10/03/16 2100 10/04/16 0500  BP: 139/71 (!) 157/68  Pulse: 68 65  Resp: 18 18  Temp: 98.3 F (36.8 C) 98.3 F (36.8 C)   Vitals:   10/03/16 0927 10/03/16 1300 10/03/16 2100 10/04/16  0500  BP:  (!) 147/72 139/71 (!) 157/68  Pulse:  66 68 65  Resp:  18 18 18   Temp:  98.6 F (37 C) 98.3 F (36.8 C) 98.3 F (36.8 C)  TempSrc:  Oral Oral Oral  SpO2: 94% 98% 97% 100%  Weight:      Height:        General: Pt is alert, awake, not in acute distress Cardiovascular: RRR, S1/S2 +, no rubs, no gallops Respiratory: CTA bilaterally, no wheezing, no rhonchi Abdominal: Soft, NT, ND, bowel sounds + Extremities: no edema, no cyanosis   The results of significant diagnostics from this hospitalization (including imaging, microbiology, ancillary and laboratory) are listed below for reference.    Significant Diagnostic Studies: Dg Chest 2 View  Result Date: 10/02/2016 CLINICAL DATA:  Weakness and dehydration. Decreased p.o. intake for 1 week. EXAM: CHEST  2 VIEW COMPARISON:  None. FINDINGS: The patient is rotated on the PA view. Lungs appear clear. Heart size is upper normal. No pneumothorax or pleural effusion. No acute bony abnormality. IMPRESSION: No acute disease. Electronically Signed   By: Inge Rise M.D.   On: 10/02/2016 15:35     Microbiology: No results found for this or any previous visit (from the past 240 hour(s)).   Labs: Basic Metabolic Panel:  Recent Labs Lab 10/02/16 1445 10/03/16 0627 10/04/16 0553  NA 141 141 143  K 4.1 4.0 4.0  CL 112* 115* 117*  CO2 17* 18* 20*  GLUCOSE 120* 107* 101*  BUN 77* 71* 61*  CREATININE 2.16* 1.86* 1.84*  CALCIUM 8.8* 8.2* 8.3*  MG  --   --  1.5*   Liver Function Tests:  Recent Labs Lab 10/02/16 1445  AST 24  ALT 31  ALKPHOS 79  BILITOT 0.5  PROT 6.6  ALBUMIN 3.3*   No results for input(s): LIPASE, AMYLASE in the last 168 hours. No results for input(s): AMMONIA in the last 168 hours. CBC:  Recent Labs Lab 10/02/16 1445 10/03/16 0627  WBC 5.1 4.0  NEUTROABS 3.8  --   HGB 11.2* 10.0*  HCT 33.7* 30.0*  MCV 92.1 91.7  PLT 210 183   Cardiac Enzymes: No results for input(s): CKTOTAL, CKMB,  CKMBINDEX, TROPONINI in the last 168 hours. BNP: Invalid input(s): POCBNP CBG: No results for input(s): GLUCAP in the last 168 hours.  Time coordinating discharge:  Greater than 30 minutes  Signed:  Malaky Tetrault, DO [facility] Pager: [phone] 10/04/2016, 9:54 AM

## 2017-01-10 DEATH — deceased

## 2017-10-22 IMAGING — DX DG CHEST 2V
2 series · 2 of 2 positions shown · non-contrast
Comparison: None.

CLINICAL DATA: Weakness and dehydration. Decreased p.o. intake for
1 week.

EXAM:
CHEST  2 VIEW

[chest pa]
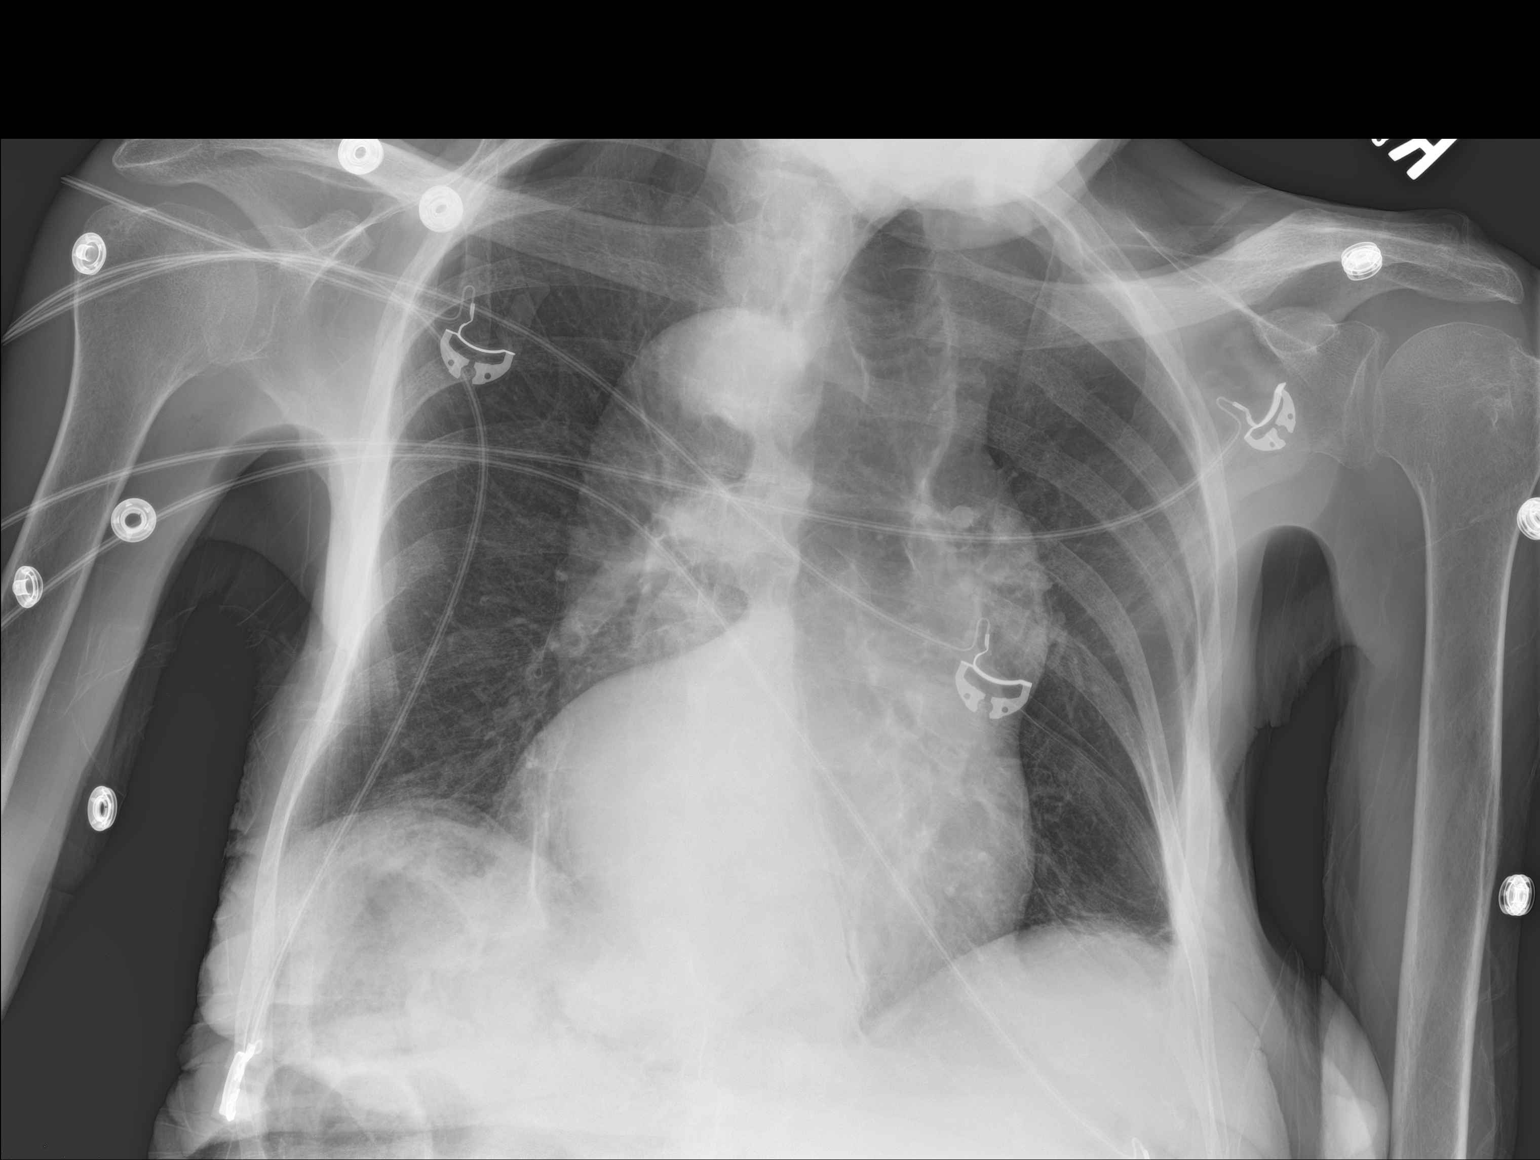

[chest lat]
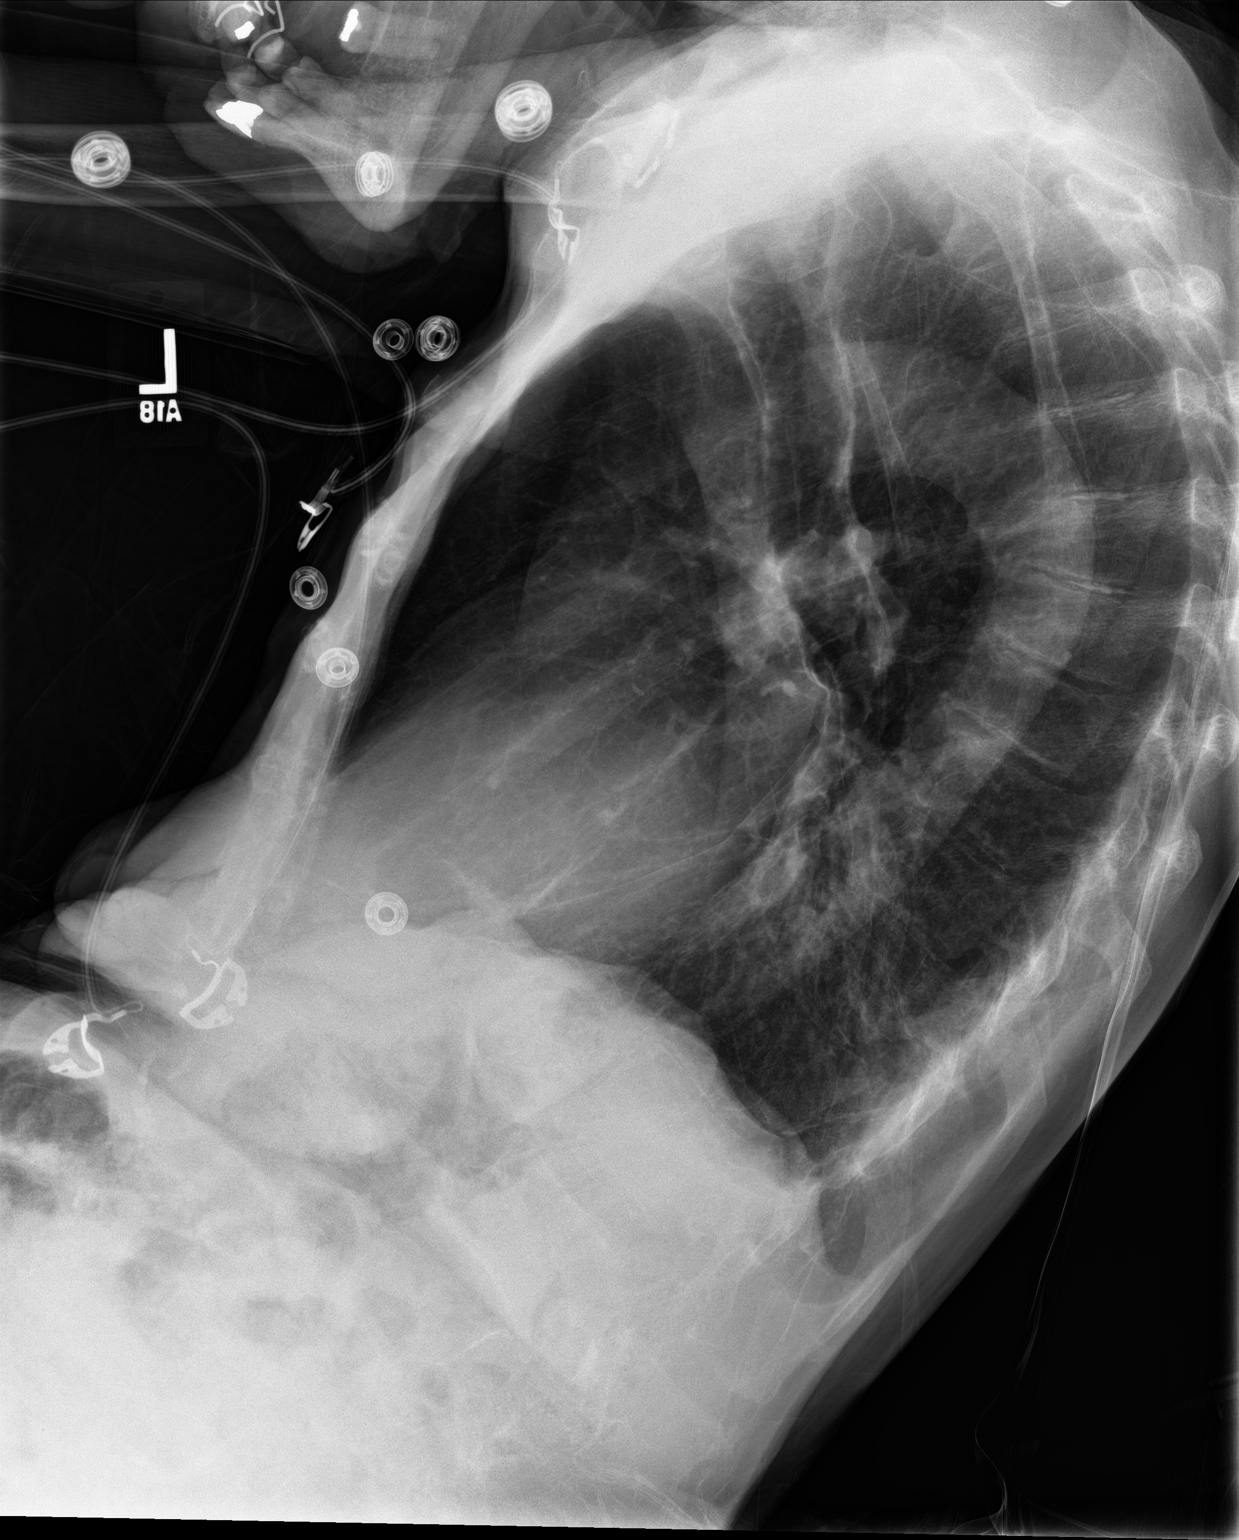

[2 of 2 positions shown; findings below may reference images not displayed]

FINDINGS: The patient is rotated on the PA view. Lungs appear clear. Heart
size is upper normal. No pneumothorax or pleural effusion. No acute
bony abnormality.
IMPRESSION: No acute disease.
